# Patient Record
Sex: Male | Born: 1980 | Race: White | Hispanic: No | Marital: Married | Smoking: Never smoker
Health system: Southern US, Community
[De-identification: ages and names within clinical notes are randomized; demographics above are authoritative.]

## PROBLEM LIST (undated history)

## (undated) DIAGNOSIS — E785 Hyperlipidemia, unspecified: Secondary | ICD-10-CM

## (undated) DIAGNOSIS — L600 Ingrowing nail: Secondary | ICD-10-CM

## (undated) HISTORY — DX: Hyperlipidemia, unspecified: E78.5

## (undated) HISTORY — DX: Ingrowing nail: L60.0

---

## 2010-05-24 ENCOUNTER — Emergency Department: Payer: Self-pay | Admitting: Emergency Medicine

## 2011-03-06 ENCOUNTER — Emergency Department: Payer: Self-pay | Admitting: Emergency Medicine

## 2011-03-06 LAB — CBC
HCT: 43.6 % (ref 40.0–52.0)
HGB: 15.1 g/dL (ref 13.0–18.0)
MCH: 31.5 pg (ref 26.0–34.0)
MCHC: 34.6 g/dL (ref 32.0–36.0)
MCV: 91 fL (ref 80–100)
Platelet: 249 10*3/uL (ref 150–440)
RBC: 4.79 10*6/uL (ref 4.40–5.90)
RDW: 11.4 % — ABNORMAL LOW (ref 11.5–14.5)
WBC: 8.1 10*3/uL (ref 3.8–10.6)

## 2011-03-06 LAB — URINALYSIS, COMPLETE
Bilirubin,UR: NEGATIVE
Blood: NEGATIVE
Glucose,UR: NEGATIVE mg/dL (ref 0–75)
Ketone: NEGATIVE
Leukocyte Esterase: NEGATIVE
Nitrite: NEGATIVE
Ph: 5 (ref 4.5–8.0)
Protein: NEGATIVE
RBC,UR: 1 /HPF (ref 0–5)
Specific Gravity: 1.023 (ref 1.003–1.030)
Squamous Epithelial: <1
WBC UR: 1 /HPF (ref 0–5)

## 2011-03-06 LAB — BASIC METABOLIC PANEL
Anion Gap: 9 (ref 7–16)
BUN: 23 mg/dL — ABNORMAL HIGH (ref 7–18)
Calcium, Total: 9 mg/dL (ref 8.5–10.1)
Chloride: 105 mmol/L (ref 98–107)
Co2: 29 mmol/L (ref 21–32)
Creatinine: 0.86 mg/dL (ref 0.60–1.30)
EGFR (African American): >60
EGFR (Non-African Amer.): >60
Glucose: 96 mg/dL (ref 65–99)
Osmolality: 289 (ref 275–301)
Potassium: 3.9 mmol/L (ref 3.5–5.1)
Sodium: 143 mmol/L (ref 136–145)

## 2011-03-06 LAB — AMYLASE: Amylase: 69 U/L (ref 25–115)

## 2011-03-06 LAB — LIPASE, BLOOD: Lipase: 97 U/L (ref 73–393)

## 2013-03-28 ENCOUNTER — Emergency Department: Payer: Self-pay | Admitting: Emergency Medicine

## 2013-03-29 LAB — URINALYSIS, COMPLETE
Bilirubin,UR: NEGATIVE
Blood: NEGATIVE
GLUCOSE, UR: NEGATIVE mg/dL (ref 0–75)
Ketone: NEGATIVE
Nitrite: NEGATIVE
PH: 7 (ref 4.5–8.0)
RBC,UR: 8 /HPF (ref 0–5)
SPECIFIC GRAVITY: 1.033 (ref 1.003–1.030)
Squamous Epithelial: NONE SEEN
WBC UR: 35 /HPF (ref 0–5)

## 2013-09-27 IMAGING — CT CT ABD-PELV W/ CM
1 of 2 series · 15 of 32 positions shown, 19 images · IV contrast (isovue)
Comparison: none

REASON FOR EXAM: (1) Right sided abdominal pain; (2) Right sided
abdominal pain
COMMENTS:   May transport without cardiac monitor

PROCEDURE:     CT  - CT ABDOMEN / PELVIS  W  - March 06, 2011  [DATE]
RESULT:     CT abdomen and pelvis dated 03/06/2011.
TECHNIQUE: Helical 3 mm sections were obtained from lung bases through the
pubic symphysis status post intravenous demonstration of 100 mL of Isovue
370 and oral contrast.

[Series 2: soft tissue · axial · 0.78mm/px · z∈[-504,-90]mm · 15 of 150 slices shown, 19 images]
[im 6/150  soft-tissue]
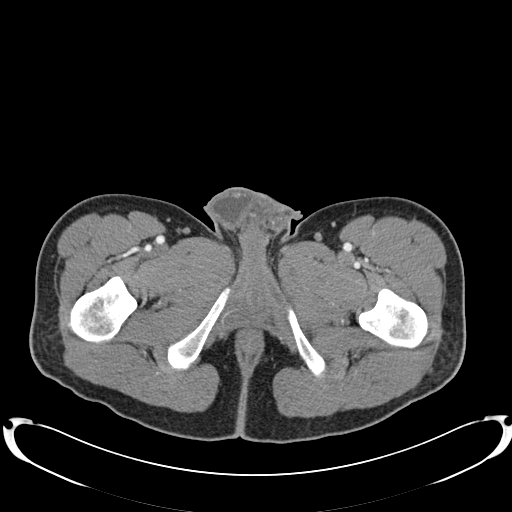
[im 6/150  bone]
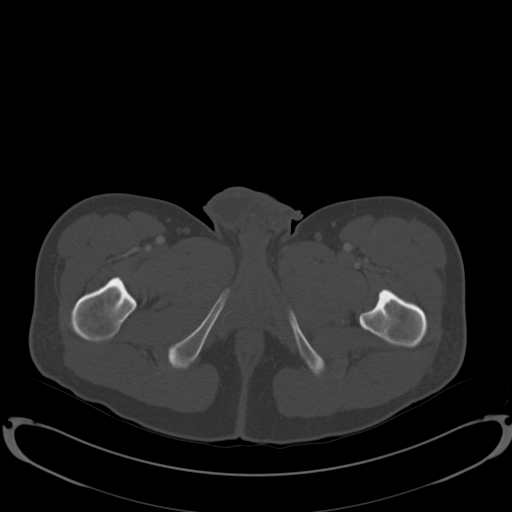
[im 18/150  soft-tissue]
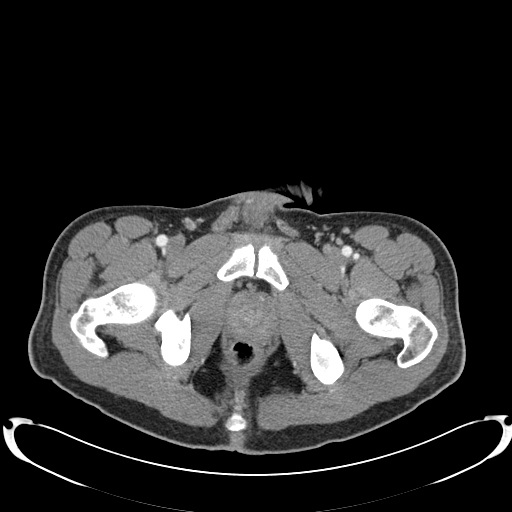
[im 30/150  soft-tissue]
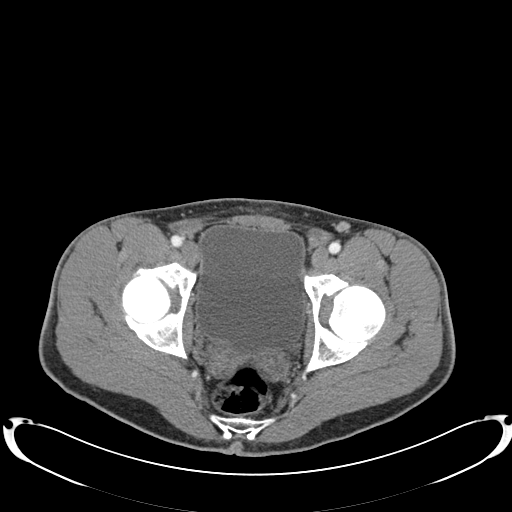
[im 42/150  soft-tissue]
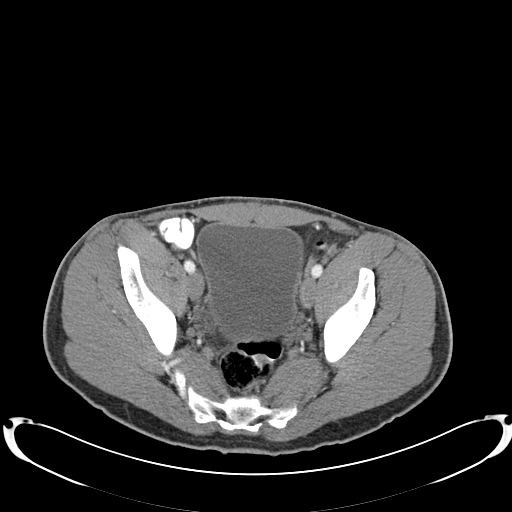
[im 54/150  soft-tissue]
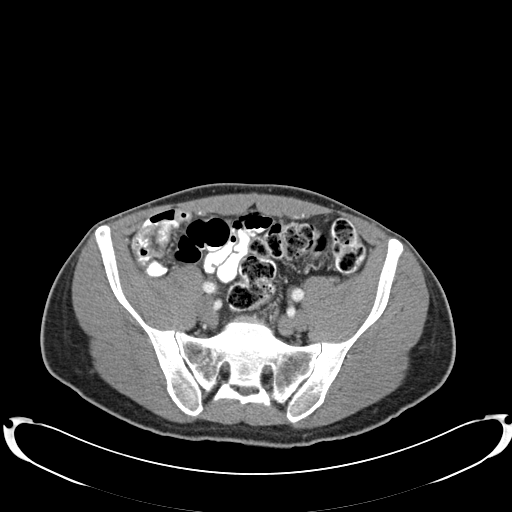
[im 66/150  soft-tissue]
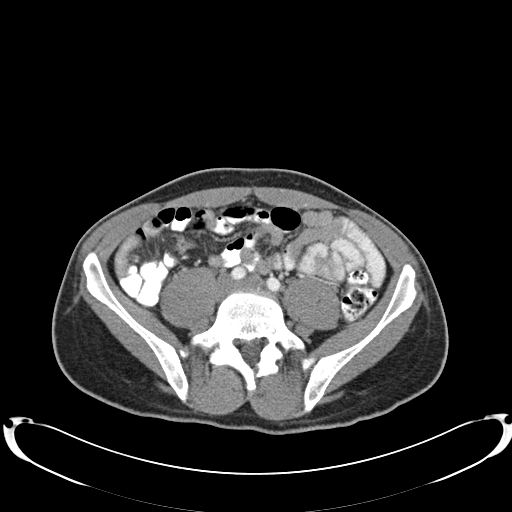
[im 78/150  soft-tissue]
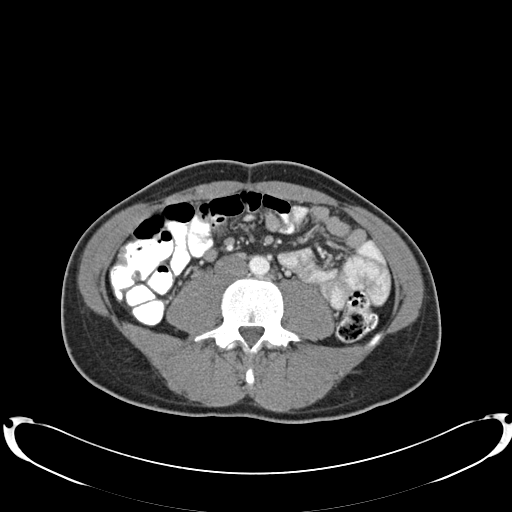
[im 84/150  soft-tissue]
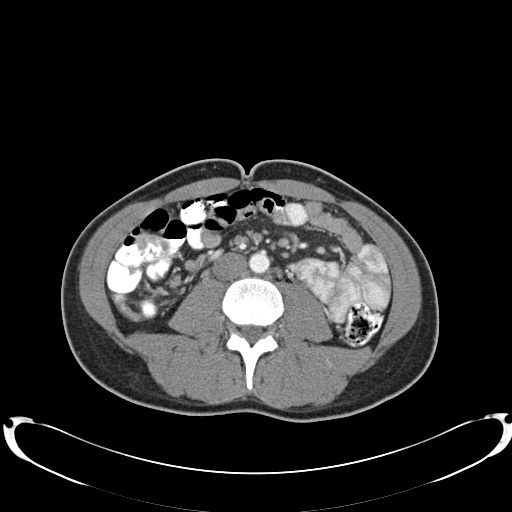
[im 96/150  soft-tissue]
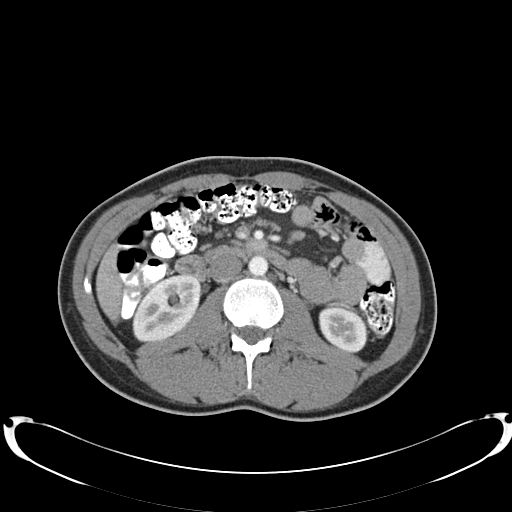
[im 96/150  bone]
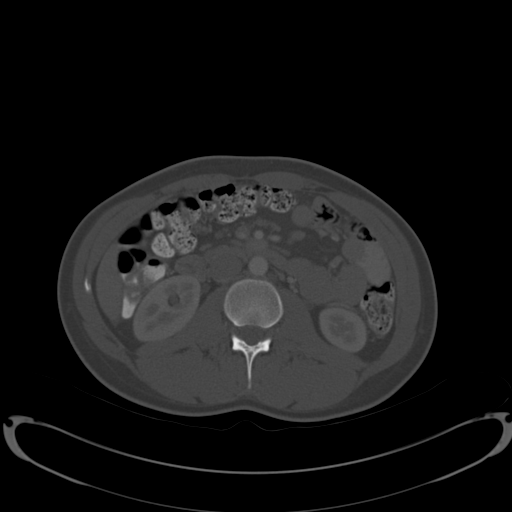
[im 108/150  soft-tissue]
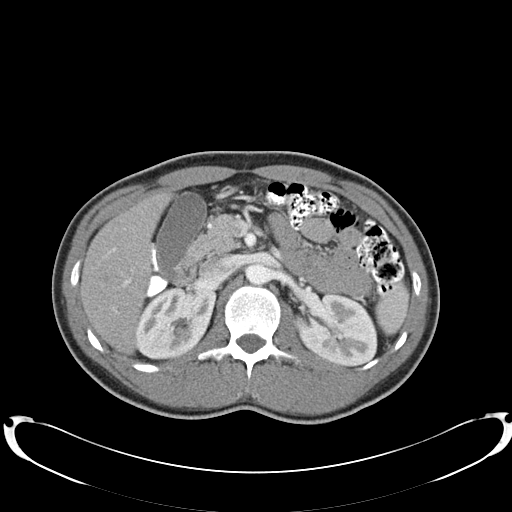
[im 120/150  soft-tissue]
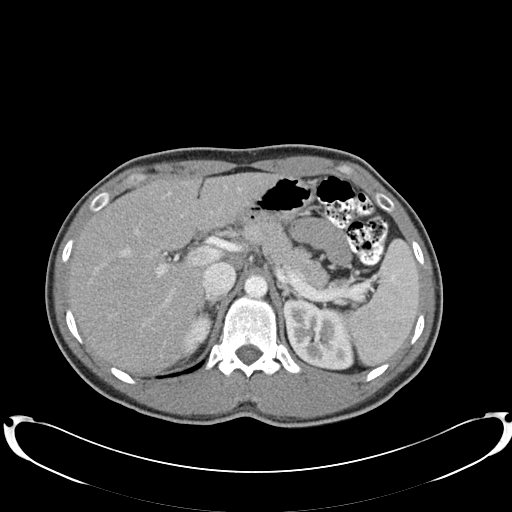
[im 126/150  lung]
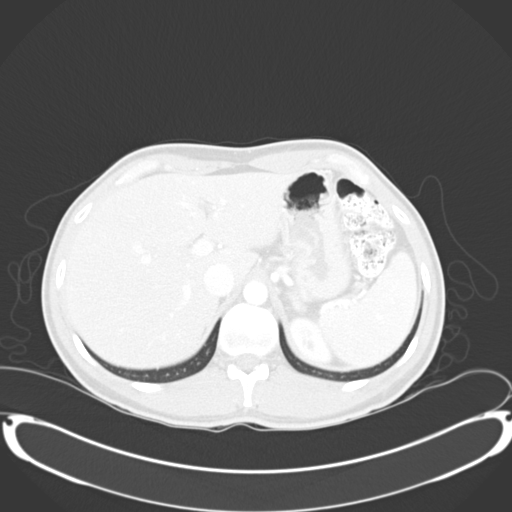
[im 132/150  soft-tissue]
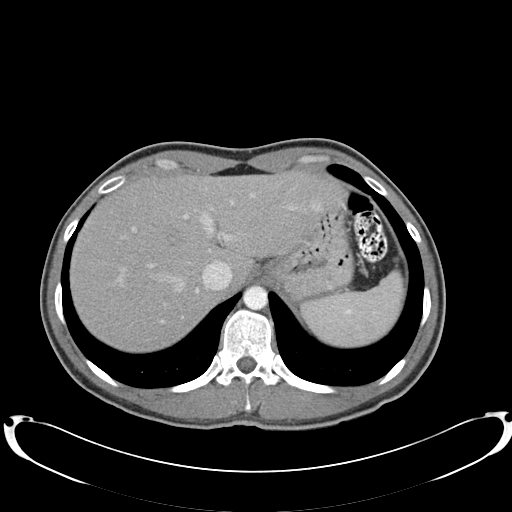
[im 132/150  lung]
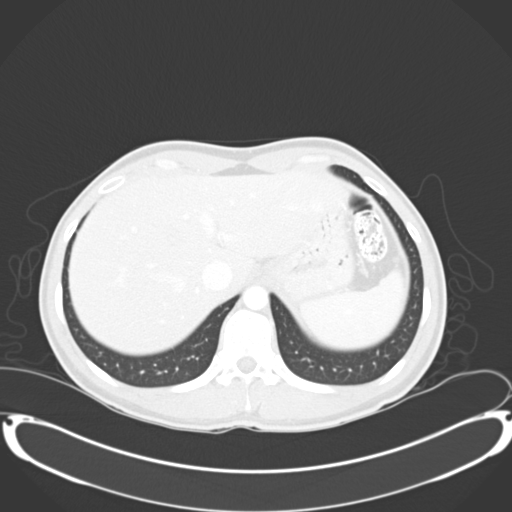
[im 138/150  lung]
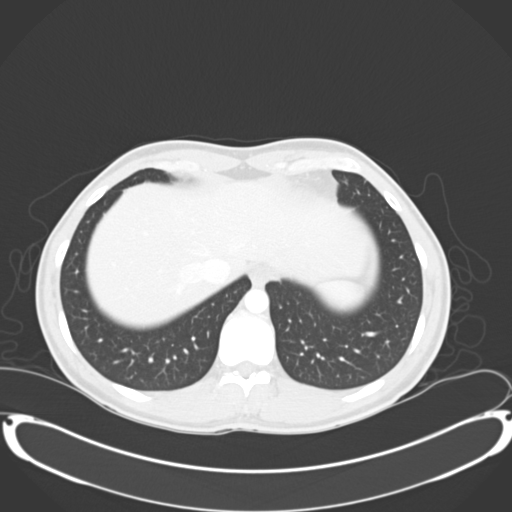
[im 144/150  soft-tissue]
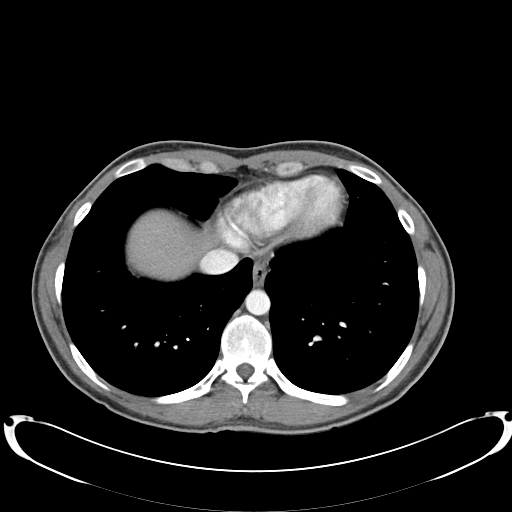
[im 144/150  lung]
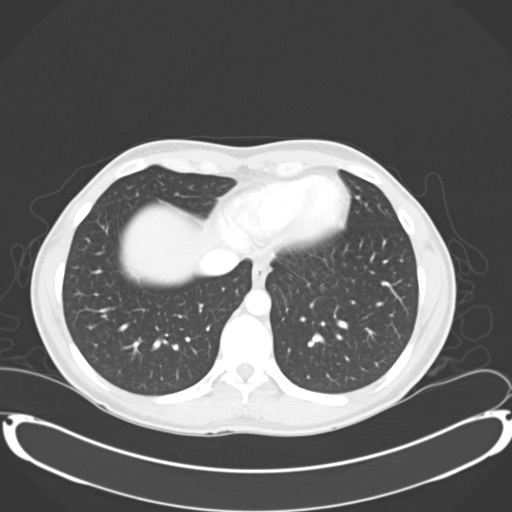

[15 of 32 positions shown; findings below may reference images not displayed]

FINDINGS: The spleen, adrenals, pancreas, kidneys are unremarkable. A small
35mm low attenuating focus projects within the dome of the liver different
considerations are a small cyst versus small hemangioma possibly biliary
hamartoma. Liver is otherwise unremarkable.

There is no CT evidence of abdominal or pelvic free fluid loculated fluid
collections or adenopathy. No evidence of an abdominal aortic aneurysm.

Evaluation the appendix demonstrates moderate dilation along the base
measuring 9.2 mm in diameter (image 68) without evidence of inflammatory
change in the periappendiceal fat or periappendiceal fluid.

There is no evidence of bowel obstruction, enteritis, colitis,
diverticulitis.
IMPRESSION: Area of moderate thickening along the base the appendix
without further secondary signs reflecting appendicitis. Clinical
correlation recommended.
2. No further evidence of obstructive or inflammatory abnormalities.

## 2014-05-31 NOTE — Consult Note (Signed)
Brief Consult Note: Diagnosis: RLQ Pain.   Patient was seen by consultant.   Consult note dictated.   Discussed with Attending MD.   Comments: Very low probability for acute appendicitis. No surgical f/u needed. Discussed concerning Sx and advised pt to return to ER if he develops any of them.  Electronic Signatures: Claude MangesMarterre, Keimya Briddell F (MD)  (Signed 28-Jan-13 17:13)  Authored: Brief Consult Note   Last Updated: 28-Jan-13 17:13 by Claude MangesMarterre, Monike Bragdon F (MD)

## 2014-05-31 NOTE — Consult Note (Signed)
PATIENT NAME:  Brad Hicks, [name] MR#:  [id number] DATE OF BIRTH:  [birth year]  DATE OF CONSULTATION:  03/06/2011  REFERRING PHYSICIAN:   CONSULTING PHYSICIAN:  Claude MangesWilliam F. Iyanni Hepp, MD  REASON FOR CONSULTATION: Possible appendicitis.   HISTORY OF PRESENT ILLNESS: Mr. Brad Hicks is a 34 year old white male who says for months he has had intermittent right lower quadrant abdominal pain. This has not been associated with fever, chills, nausea, vomiting, anorexia, or diarrhea. It is noted in reviewing his medical history that he had a short stay admission by Dr. Lemar LivingsByrnett November 2003 for possible appendicitis where Dr. Lemar LivingsByrnett did not think he had appendicitis and his CT scan was negative.   Approximately 24 hours ago, Mr. Brad Hicks experienced worse right lower quadrant pain than he usually does. This is currently better than when he sought attention in [facility] ED originally today (about eight hours ago). Again, it is not associated with nausea, vomiting, or anorexia.   PAST MEDICAL HISTORY: No medical illnesses.   MEDICATIONS: None.   DRUG ALLERGIES: No known drug allergies.   FAMILY HISTORY: Noncontributory.   REVIEW OF SYSTEMS: Negative for 10 systems except as mentioned in the history of present illness which is only abdominal pain. Pertinent negatives are a lack of nausea, anorexia, vomiting, and fever.   SOCIAL HISTORY: The patient is separated and is currently living with his parents. He does not smoke cigarettes and he does not drink alcohol. He works at [employer].   PHYSICAL EXAMINATION:   GENERAL: Healthy appearing young male in no obvious distress. Height 6 feet 0 inches, weight 163 pounds, BMI 22.1.   VITAL SIGNS: Temperature 97.5, pulse 73, respirations 20, blood pressure 117/75, oxygen saturation 98% on room air.   HEENT: Pupils equally round and reactive to light. Extraocular movements intact. Sclerae nonicteric. Oropharynx clear. Mucous membranes moist.   NECK: Supple with no  lymphadenopathy. Trachea is midline and there is no jugular venous distention.   HEART: Regular rate and rhythm with no murmurs or rubs.   LUNGS: Clear to auscultation with normal respiratory effort bilaterally.   ABDOMEN: Soft, flat, nondistended, and initially showed right lower quadrant tenderness but with repeat examination and distraction there was absolutely no tenderness in the right lower quadrant. There was certainly no rebound tenderness and there was certainly no involuntary guarding.   EXTREMITIES: No edema with normal capillary refill bilaterally.   NEUROLOGIC: Cranial nerves II through XII, motor and sensation grossly intact.   PSYCHIATRIC: Alert and oriented x4. Appropriate affect.   LABORATORY, DIAGNOSTIC, AND RADIOLOGICAL DATA: White blood cell count 8.1, hematocrit 44%, platelet count 249,000. Electrolytes, amylase, and lipase are all normal. Urinalysis is normal. CT scan of the abdomen and pelvis with contrast showed a 9.2 mm diameter appendiceal base without any inflammatory change in the periappendiceal fat or periappendiceal fluid.    ASSESSMENT: Recurrent abdominal pain. The likelihood of acute appendicitis is extremely low in this patient with an essentially normal CT scan, normal exam, no fever, no nausea, no anorexia, and a normal white blood cell count.      PLAN: No surgical follow-up necessary. I advised the patient that should he develop any of the above symptoms associated with worsening right lower quadrant pain, to return to the Emergency Department.   ____________________________ Claude MangesWilliam F. Dannika Hilgeman, MD wfm:drc D: 03/06/2011 17:12:03 ET T: 03/07/2011 08:39:13 ET JOB#: [id number]  cc: Claude MangesWilliam F. Jalesia Loudenslager, MD, <Dictator> Claude MangesWILLIAM F Chadwin Fury MD ELECTRONICALLY SIGNED 03/07/2011 10:27

## 2014-11-25 DIAGNOSIS — E785 Hyperlipidemia, unspecified: Secondary | ICD-10-CM | POA: Insufficient documentation

## 2014-12-04 ENCOUNTER — Encounter: Payer: Self-pay | Admitting: Family Medicine

## 2014-12-04 ENCOUNTER — Ambulatory Visit (INDEPENDENT_AMBULATORY_CARE_PROVIDER_SITE_OTHER): Payer: PRIVATE HEALTH INSURANCE | Admitting: Unknown Physician Specialty

## 2014-12-04 VITALS — BP 103/69 | HR 53 | Temp 98.0°F | Ht 71.5 in | Wt 182.2 lb

## 2014-12-04 DIAGNOSIS — L600 Ingrowing nail: Secondary | ICD-10-CM | POA: Diagnosis not present

## 2014-12-04 MED ORDER — SULFAMETHOXAZOLE-TRIMETHOPRIM 800-160 MG PO TABS: 1.0000 | ORAL_TABLET | Freq: Two times a day (BID) | ORAL | Status: DC

## 2014-12-04 NOTE — Progress Notes (Signed)
This  34 year old male presents  for evaluation of the following problem(s): . C1   1.  INGROWN TOENAIL Bilateral nails.   Duration:  3 months  H4 Involved toe:  right and left medial big toes  H1 Pain:  yes  H8 Swelling:  yes  H8 Redness:  yes  H8 Oozing:  yes  H8 Previous history of ingrown toenail:  yes.  Procedure done 2015 P1  Social History:  Smoking status is recorded today as Never smoker  Current Medications:   P1 Current Medications: The patient is on no medications   Medication Allergies:   Objective:   General:  Well appearing, well nourished in no distress.  Normal mood and affect.  O1 Pt with swelling, purulent drainage, and erythema right and left medial large toes  Assessment:  Problem List Items Addressed This Visit    None    Visit Diagnoses    Ingrown left big toenail    -  Primary    Ingrowing toenail of right foot        Ingrowing toenail with infection        Relevant Medications    sulfamethoxazole-trimethoprim (BACTRIM DS,SEPTRA DS) 800-160 MG tablet        Plan:  Procedure:   Diagnosis:  .MP: Ingrown toenail : ICD9 = 703.0 / ICD10 = L60.0 / SNOMED = [id number] Consent:  Description:   Digital block of the right  toe performed by injecting local anesthetic at the base of the toe at the 2 oclock and 10 oclock positions, using 3-4 cc's of lidocaine 1% plain .  After confirming adequate anesthesia, Using scissors  the nail was vertically cut beyond the epinychia to the base.  A hemostat was then used to remove the nail fragment.   Bacitracin ointment was applied to the operative site a circumferential gauze dressive was applied.  The patient tolerated the procedure well.  Complications:  none Post Procedure Instructions:  The patient was encouraged to keep the dressing in place for 24 hours and keep the foot elevated as much as possible during this time.  After the first day they are instructed to soak the toe in warm water 2 times daily for 3-4  days.  Antibiotic ointment is to be applied daily for 1 week.  The patient was informed that some oozing is to be expected for 1-2 weeks but that they should return immediately for pus, increased pain or redness.  They were instructed to take APAP or motrin as needed for post operative discomfort.  Rx for Septra BID for 7 days due to exent of infection

## 2014-12-04 NOTE — Patient Instructions (Addendum)
Fingernail or Toenail Removal Fingernail or toenail removal is a surgical procedure to take off a nail from your finger or your toe. You may need to have a fingernail or toenail removed if it has an abnormal shape (deformity) or if it is severely injured. A fingernail or toenail may also be removed due to a bacterial infection, a severe ingrown toenail, or a fungal infection that has failed treatment with antifungal medicines. LET [facility] PROVID[facility] KNOW ABOUT:  Any allergies you have.  All medicines you are taking, including vitamins, herbs, eye drops, creams, and over-the-counter medicines.  Previous problems you or members of your family have had with the use of anesthetics.  Any blood disorders you have.  Previous surgeries you have had.  Any medical conditions you may have. RISKS AND COMPLICATIONS Generally, this is a safe procedure. However, problems may occur, including:  Pain.  Bleeding.  Infection.  Regrowth of a deformed nail. BEFORE THE PROCEDURE  Ask your health care provider about changing or stopping your regular medicines. This is especially important if you are taking diabetes medicines or blood thinners.  Follow instructions from your health care provider about eating or drinking restrictions.  Plan to have someone take you home after the procedure. PROCEDURE  In some cases, your health care provider may also make a cut (incision) in your nail.  After your nail is lifted away from your toe or finger, your health care provider will detach it from your nail bed.  A germ-killing bandage (antiseptic dressing) will be put on your toe or finger. The procedure may vary among health care providers and hospitals. AFTER THE PROCEDURE  It is common to have some pain after nail removal. You will be given pain medicine as needed.  You may be given a prescription for pain medicine and antibiotic medicine.  If you had a toenail removed, you will be given a  surgical shoe to wear while you recover.  If you had a fingernail removed, you may be given a finger splint to wear while you recover.   This information is not intended to replace advice given to you by your health care provider. Make sure you discuss any questions you have with your health care provider.   Document Released: 10/22/2002 Document Revised: 06/09/2014 Document Reviewed: 01/21/2014 Elsevier Interactive Patient Education [employer]2016 [employer] Inc.  [place] it twice a day for at least 3 days.  Dry and cover with antibiotic ointment and bandage.

## 2015-01-14 ENCOUNTER — Encounter: Payer: Self-pay | Admitting: Unknown Physician Specialty

## 2015-02-03 ENCOUNTER — Encounter: Payer: PRIVATE HEALTH INSURANCE | Admitting: Unknown Physician Specialty

## 2015-03-03 ENCOUNTER — Encounter: Payer: PRIVATE HEALTH INSURANCE | Admitting: Unknown Physician Specialty

## 2015-03-15 ENCOUNTER — Encounter: Payer: Self-pay | Admitting: Unknown Physician Specialty

## 2015-03-15 ENCOUNTER — Ambulatory Visit (INDEPENDENT_AMBULATORY_CARE_PROVIDER_SITE_OTHER): Payer: PRIVATE HEALTH INSURANCE | Admitting: Unknown Physician Specialty

## 2015-03-15 VITALS — BP 110/73 | HR 56 | Temp 98.1°F | Ht 72.3 in | Wt 182.4 lb

## 2015-03-15 DIAGNOSIS — Z0000 Encounter for general adult medical examination without abnormal findings: Secondary | ICD-10-CM

## 2015-03-15 DIAGNOSIS — H6123 Impacted cerumen, bilateral: Secondary | ICD-10-CM

## 2015-03-15 NOTE — Addendum Note (Signed)
Addended by: Gabriel Cirri on: 03/15/2015 10:31 AM   Modules accepted: Kipp Brood

## 2015-03-15 NOTE — Patient Instructions (Signed)
Use Debrox for your ears

## 2015-03-15 NOTE — Progress Notes (Signed)
   BP 110/73 mmHg  Pulse 56  Temp(Src) 98.1 F (36.7 C)  Ht 6' 0.3" (1.836 m)  Wt 182 lb 6.4 oz (82.736 kg)  BMI 24.54 kg/m2  SpO2 99%   Subjective:    Patient ID: Brad Hicks, male    DOB: 01-06-1981, 35 y.o.   MRN: [id number]  HPI: Brad Hicks is a 35 y.o. male. States he did have some hearing loss in right ear a couple of months ago that has resolved.   Chief Complaint  Patient presents with  . Annual Exam    Relevant past medical, surgical, family and social history reviewed and updated as indicated. Interim medical history since our last visit reviewed. Allergies and medications reviewed and updated.  Review of Systems  Constitutional: Negative.   Eyes: Negative.   Respiratory: Negative.   Cardiovascular: Negative.   Gastrointestinal: Negative.   Endocrine: Negative.   Genitourinary: Negative.   Skin: Negative.   Allergic/Immunologic: Negative.   Neurological: Negative.   Hematological: Negative.   Psychiatric/Behavioral: Negative.    Diet & Exercise: Patient has maintained stable weight in last year and states he eats a fairly healthy diet. His job involves a lot of aerobic exercise on a daily basis.   Per HPI unless specifically indicated above     Objective:    BP 110/73 mmHg  Pulse 56  Temp(Src) 98.1 F (36.7 C)  Ht 6' 0.3" (1.836 m)  Wt 182 lb 6.4 oz (82.736 kg)  BMI 24.54 kg/m2  SpO2 99%  Wt Readings from Last 3 Encounters:  03/15/15 182 lb 6.4 oz (82.736 kg)  12/04/14 182 lb 3.2 oz (82.645 kg)  04/15/14 182 lb (82.555 kg)    Physical Exam  Constitutional: He is oriented to person, place, and time. He appears well-developed and well-nourished.  HENT:  Head: Normocephalic.  Right Ear: External ear and ear canal normal.  Left Ear: External ear and ear canal normal.  Ears:  Mouth/Throat: Uvula is midline, oropharynx is clear and moist and mucous membranes are normal.  Eyes: Pupils are equal, round, and reactive to light.  Cardiovascular:  Normal rate, regular rhythm and normal heart sounds.  Exam reveals no gallop and no friction rub.   No murmur heard. Pulmonary/Chest: Effort normal and breath sounds normal. No respiratory distress.  Abdominal: Soft. Bowel sounds are normal. He exhibits no distension. There is no tenderness.  Musculoskeletal: Normal range of motion.  Neurological: He is alert and oriented to person, place, and time. He has normal reflexes.  Skin: Skin is warm and dry.  Psychiatric: He has a normal mood and affect. His behavior is normal. Judgment and thought content normal.       Assessment & Plan:   Problem List Items Addressed This Visit    None    Visit Diagnoses    Encounter for annual health examination    -  Primary    Relevant Orders    CBC with Differential/Platelet    Comprehensive metabolic panel    HIV antibody    Lipid Panel w/o Chol/HDL Ratio    Excessive cerumen in both ear canals        Offered to irrigate ears in office or patient can try OTC Debrox. Patient will try Debrox first.         Follow up plan: Return in about 1 year (around 03/14/2016) for annual physical.

## 2015-03-16 ENCOUNTER — Encounter: Payer: Self-pay | Admitting: Unknown Physician Specialty

## 2015-03-16 LAB — COMPREHENSIVE METABOLIC PANEL
A/G RATIO: 1.9 (ref 1.1–2.5)
ALK PHOS: 62 IU/L (ref 39–117)
ALT: 21 IU/L (ref 0–44)
AST: 21 IU/L (ref 0–40)
Albumin: 4.4 g/dL (ref 3.5–5.5)
BILIRUBIN TOTAL: 0.5 mg/dL (ref 0.0–1.2)
BUN/Creatinine Ratio: 27 — ABNORMAL HIGH (ref 8–19)
BUN: 21 mg/dL — AB (ref 6–20)
CHLORIDE: 102 mmol/L (ref 96–106)
CO2: 23 mmol/L (ref 18–29)
Calcium: 9.4 mg/dL (ref 8.7–10.2)
Creatinine, Ser: 0.79 mg/dL (ref 0.76–1.27)
GFR calc non Af Amer: 116 mL/min/{1.73_m2} (ref >59)
GFR, EST AFRICAN AMERICAN: 134 mL/min/{1.73_m2} (ref >59)
Globulin, Total: 2.3 g/dL (ref 1.5–4.5)
Glucose: 68 mg/dL (ref 65–99)
POTASSIUM: 4.5 mmol/L (ref 3.5–5.2)
Sodium: 143 mmol/L (ref 134–144)
Total Protein: 6.7 g/dL (ref 6.0–8.5)

## 2015-03-16 LAB — HIV ANTIBODY (ROUTINE TESTING W REFLEX): HIV SCREEN 4TH GENERATION: NONREACTIVE

## 2015-03-16 LAB — CBC WITH DIFFERENTIAL/PLATELET
BASOS: 1 %
Basophils Absolute: 0 10*3/uL (ref 0.0–0.2)
EOS (ABSOLUTE): 0.2 10*3/uL (ref 0.0–0.4)
Eos: 5 %
Hematocrit: 42.6 % (ref 37.5–51.0)
Hemoglobin: 14.7 g/dL (ref 12.6–17.7)
IMMATURE GRANS (ABS): 0 10*3/uL (ref 0.0–0.1)
Immature Granulocytes: 0 %
Lymphocytes Absolute: 1.9 10*3/uL (ref 0.7–3.1)
Lymphs: 39 %
MCH: 30.2 pg (ref 26.6–33.0)
MCHC: 34.5 g/dL (ref 31.5–35.7)
MCV: 88 fL (ref 79–97)
MONOS ABS: 0.4 10*3/uL (ref 0.1–0.9)
Monocytes: 8 %
NEUTROS ABS: 2.4 10*3/uL (ref 1.4–7.0)
NEUTROS PCT: 47 %
Platelets: 283 10*3/uL (ref 150–379)
RBC: 4.86 x10E6/uL (ref 4.14–5.80)
RDW: 13.1 % (ref 12.3–15.4)
WBC: 4.9 10*3/uL (ref 3.4–10.8)

## 2015-03-16 LAB — LIPID PANEL W/O CHOL/HDL RATIO
CHOLESTEROL TOTAL: 204 mg/dL — AB (ref 100–199)
HDL: 53 mg/dL (ref >39)
LDL Calculated: 132 mg/dL — ABNORMAL HIGH (ref 0–99)
TRIGLYCERIDES: 95 mg/dL (ref 0–149)
VLDL Cholesterol Cal: 19 mg/dL (ref 5–40)

## 2016-03-15 ENCOUNTER — Encounter: Payer: PRIVATE HEALTH INSURANCE | Admitting: Unknown Physician Specialty

## 2016-08-29 ENCOUNTER — Ambulatory Visit
Admission: EM | Admit: 2016-08-29 | Discharge: 2016-08-29 | Disposition: A | Discharge status: Home / Self Care | Payer: PRIVATE HEALTH INSURANCE | Attending: Family Medicine | Admitting: Family Medicine

## 2016-08-29 DIAGNOSIS — L03031 Cellulitis of right toe: Secondary | ICD-10-CM | POA: Diagnosis not present

## 2016-08-29 DIAGNOSIS — L600 Ingrowing nail: Secondary | ICD-10-CM | POA: Diagnosis not present

## 2016-08-29 DIAGNOSIS — L089 Local infection of the skin and subcutaneous tissue, unspecified: Secondary | ICD-10-CM

## 2016-08-29 DIAGNOSIS — M79674 Pain in right toe(s): Secondary | ICD-10-CM | POA: Diagnosis not present

## 2016-08-29 MED ORDER — SULFAMETHOXAZOLE-TRIMETHOPRIM 800-160 MG PO TABS: 1.0000 | ORAL_TABLET | Freq: Two times a day (BID) | ORAL | 0 refills | Status: DC

## 2016-08-29 MED ORDER — MUPIROCIN 2 % EX OINT: 1.0000 "application " | TOPICAL_OINTMENT | Freq: Three times a day (TID) | CUTANEOUS | 0 refills | Status: DC

## 2016-08-29 NOTE — ED Provider Notes (Signed)
CSN: [id number]     Arrival date & time 08/29/16  1849 History   None    Chief Complaint  Patient presents with  . Ingrown Toenail   (Consider location/radiation/quality/duration/timing/severity/associated sxs/prior Treatment) HPI  This is a 36 year old male presents with a two-month history of ingrown toenail on his right big toe. Is been using foot soaks with hydrogen peroxide vinegar and water to no avail. His had this recurrently having partial removal of the nail in October 2016. Although it has grown back ingrown on occasion since then. He states that he has been unable to get off work to be seen. Is not had any fever or chills. The toe is exquisitely tender.        Past Medical History:  Diagnosis Date  . Hyperlipidemia   . Ingrown toenail    History reviewed. No pertinent surgical history. Family History  Problem Relation Age of Onset  . Hypertension Father   . Healthy Mother    Social History  Substance Use Topics  . Smoking status: Never Smoker  . Smokeless tobacco: Never Used  . Alcohol use No    Review of Systems  Constitutional: Positive for activity change. Negative for chills, fatigue and fever.  Musculoskeletal: Positive for gait problem.  Skin: Positive for color change and wound.  All other systems reviewed and are negative.   Allergies  Patient has no known allergies.  Home Medications   Prior to Admission medications   Medication Sig Start Date End Date Taking? Authorizing Provider  mupirocin ointment (BACTROBAN) 2 % Apply 1 application topically 3 (three) times daily. 08/29/16   Lutricia Feil, PA-C  sulfamethoxazole-trimethoprim (BACTRIM DS,SEPTRA DS) 800-160 MG tablet Take 1 tablet by mouth 2 (two) times daily. 08/29/16   Lutricia Feil, PA-C   Meds Ordered and Administered this Visit  Medications - No data to display  BP 114/79 (BP Location: Left Arm)   Pulse 62   Temp 98.3 F (36.8 C) (Oral)   Resp 16   Ht 6' (1.829 m)   Wt 185  lb (83.9 kg)   SpO2 99%   BMI 25.09 kg/m  No data found.   Physical Exam  Constitutional: He is oriented to person, place, and time. He appears well-developed and well-nourished. No distress.  HENT:  Head: Normocephalic.  Eyes: Pupils are equal, round, and reactive to light.  Neck: Normal range of motion.  Musculoskeletal: Normal range of motion. He exhibits edema and tenderness.  Neurological: He is alert and oriented to person, place, and time.  Skin: Skin is warm and dry. He is not diaphoretic.  Examination of the right big toe shows drainage present mostly from the lateral aspect. He also has infection along the medial border referred to photographs for detail.  Psychiatric: He has a normal mood and affect. His behavior is normal. Judgment and thought content normal.  Nursing note and vitals reviewed.       Urgent Care Course     Procedures (including critical care time)  Labs Review Labs Reviewed - No data to display  Imaging Review No results found.   Visual Acuity Review  Right Eye Distance:   Left Eye Distance:   Bilateral Distance:    Right Eye Near:   Left Eye Near:    Bilateral Near:     Procedure: The toe was widely prepped with chlorhexidine antibiotic solution. An 18-gauge needle on a 3 mL syringe was used to gently tease out skin from the nailbed along  the medial and lateral border. A small amount of purulence was obtained from the lateral border but not medial. Procedure was  terminated this point. Advised her dressing was applied after painting the toenail area with bacitracin ointment.    MDM   1. Ingrown toenail of right foot with infection    Discharge Medication List as of 08/29/2016  7:38 PM    START taking these medications   Details  mupirocin ointment (BACTROBAN) 2 % Apply 1 application topically 3 (three) times daily., Starting Tue 08/29/2016, Normal    sulfamethoxazole-trimethoprim (BACTRIM DS,SEPTRA DS) 800-160 MG tablet Take 1  tablet by mouth 2 (two) times daily., Starting Tue 08/29/2016, Normal      Plan: 1. Test/x-Mallon results and diagnosis reviewed with patient 2. rx as per orders; risks, benefits, potential side effects reviewed with patient 3. Recommend supportive treatment with Warm compresses outlined to the patient. He will apply this 3 times daily for 10 minutes at a time. Then place Bactroban ointment around the nail edges to all red areas. Keep Dry as much as possible. Patient will schedule appointment with the podiatrist as soon as possible. We'll treat the infection at this point in time and defer definitive treatment to podiatry. 4. F/u prn if symptoms worsen or don't improve     Lutricia FeilRoemer, Raef Sprigg P, PA-C 08/29/16 1955

## 2016-08-29 NOTE — Discharge Instructions (Signed)
Apply a warm compress to the big toe 3 times daily as we discussed. Wet a washcloth under the faucet and place it in the microwave for 10-15 seconds. Apply to your big toe and leave it there for 10 minutes returning to the microwave to keep the washcloth moist to allow for 10 minutes placed warm on the toe. After 10 minutes ,dry the toe thoroughly and apply Bactroban ointment to the toe all areas that are red and swollen. Take the Septra 1 twice daily until completing the prescription. Follow-up with Dr. Ether GriffinsFowler the podiatrist as soon as you can obtain an appointment.

## 2016-08-29 NOTE — ED Triage Notes (Signed)
36 year old Caucasian male is here tonight with complaints of an ingrown toenail on his big right toe for the last two months. He states he has not been able to get off work to go and have his toe seen. He states he has been soaking his foot in hydrogen peroxide and vinegar and water. He states the toe has been draining some but still not having any relief. He states has had two ingrown toenails before as well on the same toe,

## 2017-10-22 ENCOUNTER — Encounter: Payer: Self-pay | Admitting: Nurse Practitioner

## 2017-10-29 ENCOUNTER — Encounter: Payer: Self-pay | Admitting: Nurse Practitioner

## 2018-08-22 ENCOUNTER — Other Ambulatory Visit: Payer: Self-pay

## 2018-08-22 ENCOUNTER — Ambulatory Visit (INDEPENDENT_AMBULATORY_CARE_PROVIDER_SITE_OTHER): Payer: PRIVATE HEALTH INSURANCE | Admitting: Adult Health

## 2018-08-22 ENCOUNTER — Encounter: Payer: Self-pay | Admitting: Adult Health

## 2018-08-22 VITALS — BP 106/74 | HR 63 | Resp 16 | Ht 72.0 in | Wt 199.0 lb

## 2018-08-22 DIAGNOSIS — R300 Dysuria: Secondary | ICD-10-CM

## 2018-08-22 DIAGNOSIS — Z0001 Encounter for general adult medical examination with abnormal findings: Secondary | ICD-10-CM

## 2018-08-22 NOTE — Progress Notes (Signed)
[facility] [place], [place] [address]  Internal MEDICINE  Office Visit Note  Patient Name: Brad Hicks  [id number]  [id number]  Date of Service: 08/22/2018  Chief Complaint  Patient presents with  . Annual Exam     HPI Pt is here for routine health maintenance examination.  He is a well appearing 38 yo. 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 He has 3 children under the age of 25.  He is currently divorced.  He denies alcohol, tobacco or illicit drug use. Overall he is feeling well, denies any pain or need at this time.    Current Medication: Outpatient Encounter Medications as of 08/22/2018  Medication Sig  . [DISCONTINUED] mupirocin ointment (BACTROBAN) 2 % Apply 1 application topically 3 (three) times daily. (Patient not taking: Reported on 08/22/2018)  . [DISCONTINUED] sulfamethoxazole-trimethoprim (BACTRIM DS,SEPTRA DS) 800-160 MG tablet Take 1 tablet by mouth 2 (two) times daily. (Patient not taking: Reported on 08/22/2018)   No facility-administered encounter medications on file as of 08/22/2018.     Surgical History: History reviewed. No pertinent surgical history.  Medical History: Past Medical History:  Diagnosis Date  . Hyperlipidemia   . Ingrown toenail     Family History: Family History  Problem Relation Age of Onset  . Hypertension Father   . Healthy Mother       Review of Systems  Constitutional: Negative.  Negative for chills, fatigue and unexpected weight change.  HENT: Negative.  Negative for congestion, rhinorrhea, sneezing and sore throat.   Eyes: Negative for redness.  Respiratory: Negative.  Negative for cough, chest tightness and shortness of breath.   Cardiovascular: Negative.  Negative for chest pain and palpitations.  Gastrointestinal: Negative.  Negative for abdominal pain, constipation, diarrhea, nausea and vomiting.  Endocrine: Negative.   Genitourinary: Negative.  Negative for dysuria and frequency.   Musculoskeletal: Negative.  Negative for arthralgias, back pain, joint swelling and neck pain.  Skin: Negative.  Negative for rash.  Allergic/Immunologic: Negative.   Neurological: Negative.  Negative for tremors and numbness.  Hematological: Negative for adenopathy. Does not bruise/bleed easily.  Psychiatric/Behavioral: Negative.  Negative for behavioral problems, sleep disturbance and suicidal ideas. The patient is not nervous/anxious.      Vital Signs: BP 106/74   Pulse 63   Resp 16   Ht 6' (1.829 m)   Wt 199 lb (90.3 kg)   SpO2 97%   BMI 26.99 kg/m    Physical Exam Vitals signs and nursing note reviewed.  Constitutional:      General: He is not in acute distress.    Appearance: He is well-developed. He is not diaphoretic.  HENT:     Head: Normocephalic and atraumatic.     Mouth/Throat:     Pharynx: No oropharyngeal exudate.  Eyes:     Pupils: Pupils are equal, round, and reactive to light.  Neck:     Musculoskeletal: Normal range of motion and neck supple.     Thyroid: No thyromegaly.     Vascular: No JVD.     Trachea: No tracheal deviation.  Cardiovascular:     Rate and Rhythm: Normal rate and regular rhythm.     Heart sounds: Normal heart sounds. No murmur. No friction rub. No gallop.   Pulmonary:     Effort: Pulmonary effort is normal. No respiratory distress.     Breath sounds: Normal breath sounds. No wheezing or rales.  Chest:     Chest wall: No tenderness.  Abdominal:     Palpations: Abdomen is soft.     Tenderness: There is no abdominal tenderness. There is no guarding.  Musculoskeletal: Normal range of motion.  Lymphadenopathy:     Cervical: No cervical adenopathy.  Skin:    General: Skin is warm and dry.  Neurological:     Mental Status: He is alert and oriented to person, place, and time.     Cranial Nerves: No cranial nerve deficit.  Psychiatric:        Behavior: Behavior normal.        Thought Content: Thought content normal.         Judgment: Judgment normal.      LABS: No results found for this or any previous visit (from the past 2160 hour(s)).  Assessment/Plan: 1. Encounter for general adult medical examination with abnormal findings Pt is up do date on PHM. Will get labs and follow up when results are available.    - CBC with Differential/Platelet - Lipid Panel With LDL/HDL Ratio - TSH - T4, free - Comprehensive metabolic panel  2. Dysuria - Urinalysis, Routine w reflex microscopic  General Counseling: Verdon CumminsJesse verbalizes understanding of the findings of todays visit and agrees with plan of treatment. I have discussed any further diagnostic evaluation that may be needed or ordered today. We also reviewed his medications today. he has been encouraged to call the office with any questions or concerns that should arise related to todays visit.   Orders Placed This Encounter  Procedures  . Urinalysis, Routine w reflex microscopic    No orders of the defined types were placed in this encounter.   Time spent: 30 Minutes   This patient was seen by Blima LedgerAdam Taysom Glymph AGNP-C in Collaboration with Dr Lyndon CodeFozia M Khan as a part of collaborative care agreement    Johnna AcostaAdam J. Jyles Sontag AGNP-C Internal Medicine

## 2018-08-23 LAB — URINALYSIS, ROUTINE W REFLEX MICROSCOPIC
Bilirubin, UA: NEGATIVE
Glucose, UA: NEGATIVE
Ketones, UA: NEGATIVE
Leukocytes,UA: NEGATIVE
Nitrite, UA: NEGATIVE
Protein,UA: NEGATIVE
RBC, UA: NEGATIVE
Specific Gravity, UA: >=1.03 — AB (ref 1.005–1.030)
Urobilinogen, Ur: 1 mg/dL (ref 0.2–1.0)
pH, UA: 6.5 (ref 5.0–7.5)

## 2018-09-19 ENCOUNTER — Ambulatory Visit: Payer: PRIVATE HEALTH INSURANCE | Admitting: Adult Health

## 2018-10-28 ENCOUNTER — Encounter: Payer: Self-pay | Admitting: Adult Health

## 2019-05-09 ENCOUNTER — Ambulatory Visit: Payer: Commercial Managed Care - PPO | Admitting: Podiatry

## 2019-05-16 ENCOUNTER — Ambulatory Visit: Payer: Commercial Managed Care - PPO | Admitting: Podiatry

## 2019-05-16 ENCOUNTER — Other Ambulatory Visit: Payer: Self-pay

## 2019-05-16 DIAGNOSIS — L600 Ingrowing nail: Secondary | ICD-10-CM

## 2019-05-16 MED ORDER — GENTAMICIN SULFATE 0.1 % EX CREA: 1.0000 "application " | TOPICAL_CREAM | Freq: Two times a day (BID) | CUTANEOUS | 1 refills | Status: AC

## 2019-05-16 MED ORDER — DOXYCYCLINE HYCLATE 100 MG PO TABS: 100.0000 mg | ORAL_TABLET | Freq: Two times a day (BID) | ORAL | 0 refills | Status: AC

## 2019-05-19 NOTE — Progress Notes (Signed)
   Subjective: Patient presents today for evaluation of throbbing pain to the medial and lateral borders of the left great toenail that began about one month ago. He reports associated redness and swelling. Patient is concerned for possible ingrown nail. Walking and wearing shoes increases the pain. He has trimmed the nail himself and has been soaking the toe in Epsom salt for treatment. Patient presents today for further treatment and evaluation.  Past Medical History:  Diagnosis Date  . Hyperlipidemia   . Ingrown toenail     Objective:  General: Well developed, nourished, in no acute distress, alert and oriented x3   Dermatology: Skin is warm, dry and supple bilateral. Medial and lateral borders left great toe appears to be erythematous with evidence of an ingrowing nail. Pain on palpation noted to the border of the nail fold. The remaining nails appear unremarkable at this time. There are no open sores, lesions.  Vascular: Dorsalis Pedis artery and Posterior Tibial artery pedal pulses palpable. No lower extremity edema noted.   Neruologic: Grossly intact via light touch bilateral.  Musculoskeletal: Muscular strength within normal limits in all groups bilateral. Normal range of motion noted to all pedal and ankle joints.   Assesement: #1 Paronychia with ingrowing nail medial and lateral borders left great toe #2 Pain in toe #3 Incurvated nail  Plan of Care:  1. Patient evaluated.  2. Discussed treatment alternatives and plan of care. Explained nail avulsion procedure and post procedure course to patient. 3. Patient opted for permanent partial nail avulsion of the medial and lateral borders left great toe.  4. Prior to procedure, local anesthesia infiltration utilized using 3 ml of a 50:50 mixture of 2% plain lidocaine and 0.5% plain marcaine in a normal hallux block fashion and a betadine prep performed.  5. Partial permanent nail avulsion with chemical matrixectomy performed using  3x30sec applications of phenol followed by alcohol flush.  6. Light dressing applied. 7. Prescription for Gentamicin cream provided to patient to use daily with a bandage.  8. Prescription for Doxycycline 100 mg #20 provided to patient.  9. Return to clinic in 2 weeks.  Works for [employer] .   Felecia Shelling, DPM [facility]  Dr. Felecia Shelling, DPM    [address]                                        [place], [place] [address]                Office [phone]  Fax [phone]

## 2019-05-30 ENCOUNTER — Other Ambulatory Visit: Payer: Self-pay

## 2019-05-30 ENCOUNTER — Ambulatory Visit: Payer: Commercial Managed Care - PPO | Admitting: Podiatry

## 2019-05-30 DIAGNOSIS — L600 Ingrowing nail: Secondary | ICD-10-CM | POA: Diagnosis not present

## 2019-06-03 NOTE — Progress Notes (Signed)
   Subjective: 39 y.o. male presents today status post permanent nail avulsion procedure of the medial and lateral borders of the left hallux that was performed on 05/16/2019. He reports some dull pain. He reports some associated redness and swelling. He states the toe has improved. He has been using Gentamicin cream and taking Doxycycline as directed. Patient is here for further evaluation and treatment.    Past Medical History:  Diagnosis Date  . Hyperlipidemia   . Ingrown toenail     Objective: Skin is warm, dry and supple. Nail and respective nail fold appears to be healing appropriately. Open wound to the associated nail fold with a granular wound base and moderate amount of fibrotic tissue. Minimal drainage noted. Mild erythema around the periungual region likely due to phenol chemical matricectomy.  Assessment: #1 postop permanent partial nail avulsion medial and lateral borders left hallux #2 open wound periungual nail fold of respective digit.   Plan of care: #1 patient was evaluated  #2 debridement of open wound was performed to the periungual border of the respective toe using a currette. Antibiotic ointment and Band-Aid was applied. #3 patient is to return to clinic on a PRN basis.  Works for [employer] .   Felecia Shelling, DPM [facility]  Dr. Felecia Shelling, DPM    [id number]                                        [place], [place] [address]                Office [phone]  Fax [phone]
